# Patient Record
Sex: Female | Born: 2005
Health system: Southern US, Community
[De-identification: ages and names within clinical notes are randomized; demographics above are authoritative.]

## PROBLEM LIST (undated history)

## (undated) DIAGNOSIS — H6690 Otitis media, unspecified, unspecified ear: Secondary | ICD-10-CM

## (undated) DIAGNOSIS — K5900 Constipation, unspecified: Secondary | ICD-10-CM

## (undated) DIAGNOSIS — K219 Gastro-esophageal reflux disease without esophagitis: Secondary | ICD-10-CM

## (undated) DIAGNOSIS — H7090 Unspecified mastoiditis, unspecified ear: Secondary | ICD-10-CM

## (undated) HISTORY — DX: Gastro-esophageal reflux disease without esophagitis: K21.9

---
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
## 2023-01-09 ENCOUNTER — Ambulatory Visit: Payer: Medicaid Other | Admitting: Gastroenterology

## 2023-02-25 DIAGNOSIS — R110 Nausea: Secondary | ICD-10-CM | POA: Diagnosis not present

## 2023-02-25 DIAGNOSIS — Z7184 Encounter for health counseling related to travel: Secondary | ICD-10-CM | POA: Diagnosis not present

## 2023-03-24 DIAGNOSIS — J209 Acute bronchitis, unspecified: Secondary | ICD-10-CM | POA: Diagnosis not present

## 2023-03-24 DIAGNOSIS — R051 Acute cough: Secondary | ICD-10-CM | POA: Diagnosis not present

## 2023-03-31 DIAGNOSIS — R059 Cough, unspecified: Secondary | ICD-10-CM | POA: Diagnosis not present

## 2023-03-31 DIAGNOSIS — J40 Bronchitis, not specified as acute or chronic: Secondary | ICD-10-CM | POA: Diagnosis not present

## 2023-03-31 DIAGNOSIS — J329 Chronic sinusitis, unspecified: Secondary | ICD-10-CM | POA: Diagnosis not present

## 2023-06-11 IMAGING — CT CT HEAD W/O CM
3 series · 14 of 47 positions shown, 16 images · non-contrast
Comparison: 06/13/2021 CT head.

CLINICAL DATA: Otitis media versus early mastoiditis with no
abscess on 06/13/2021

EXAM:
CT HEAD WITHOUT CONTRAST
TECHNIQUE: Contiguous axial images were obtained from the base of the skull
through the vertex without intravenous contrast.

[Series 2: head wo · axial · 0.46mm/px · z∈[+1338,+1494]mm · 8 of 37 slices shown, 10 images]
[im 3/37  brain]
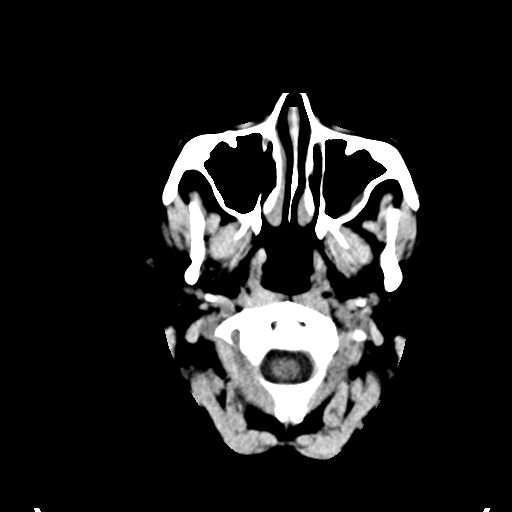
[im 3/37  bone]
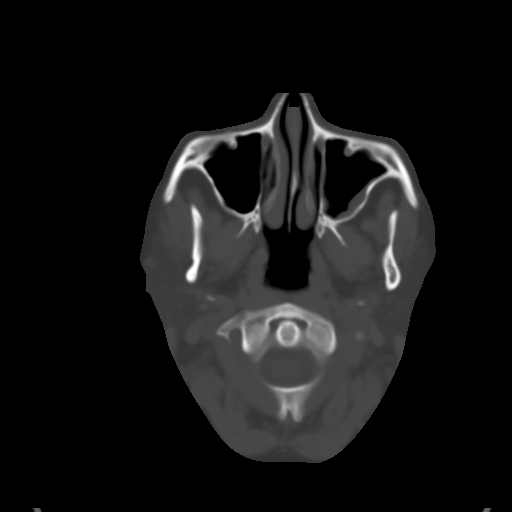
[im 8/37  brain]
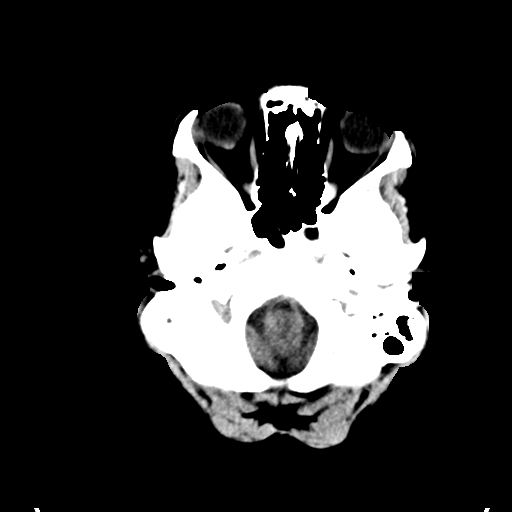
[im 12/37  brain]
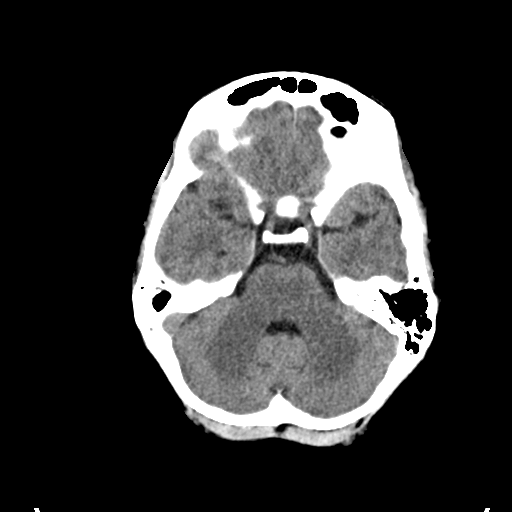
[im 17/37  brain]
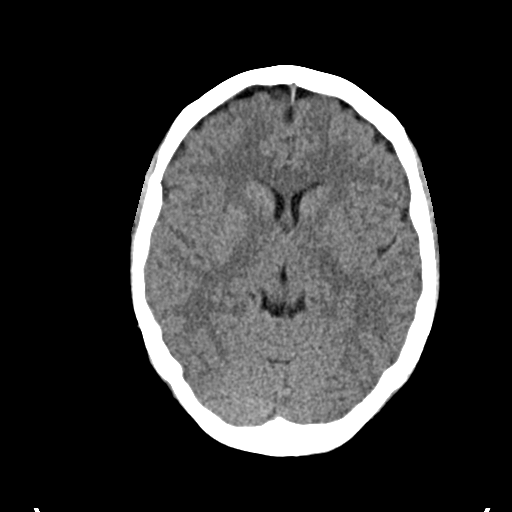
[im 20/37  brain]
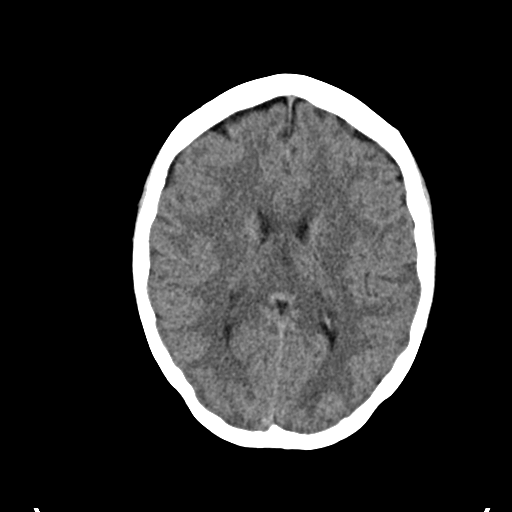
[im 20/37  bone]
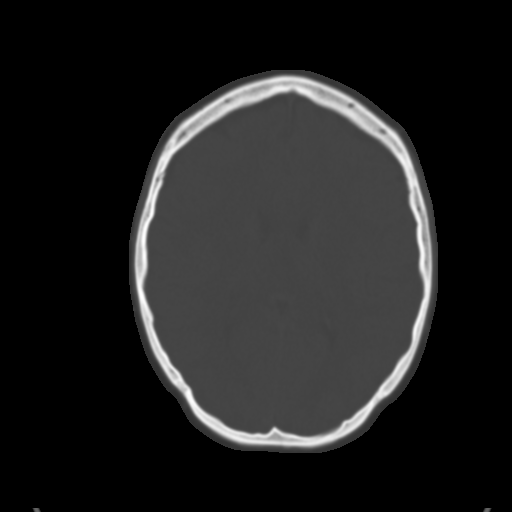
[im 25/37  brain]
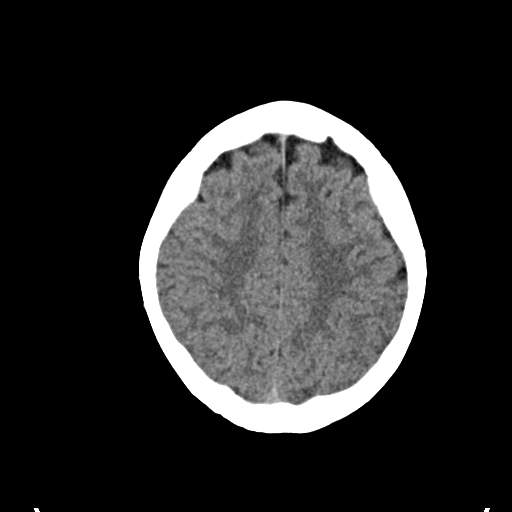
[im 29/37  brain]
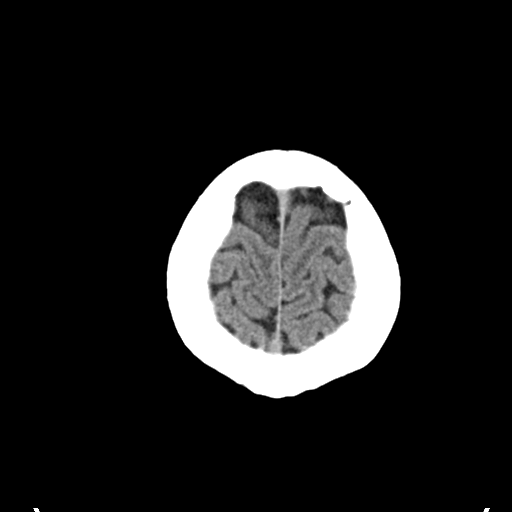
[im 34/37  brain]
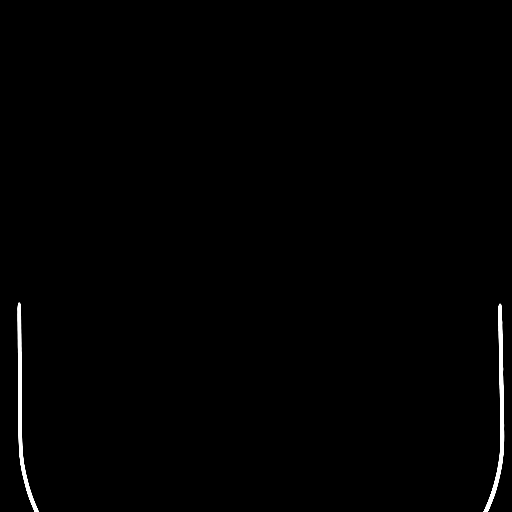

[Series 4: coronal soft · coronal · 0.35mm/px · 3 of 67 slices shown]
[im 23/67  brain]
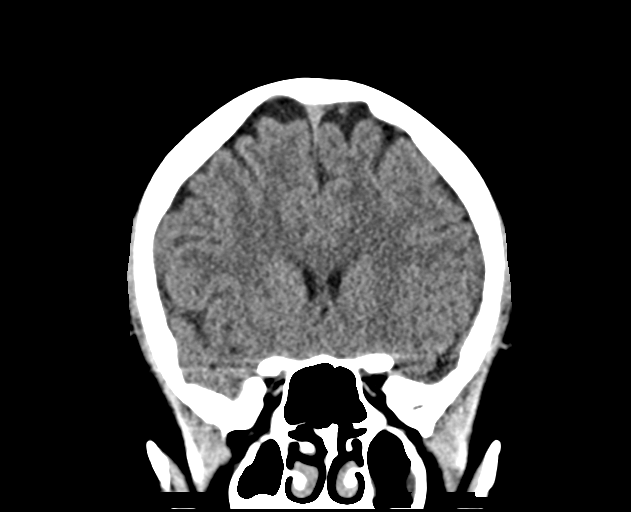
[im 30/67  brain]
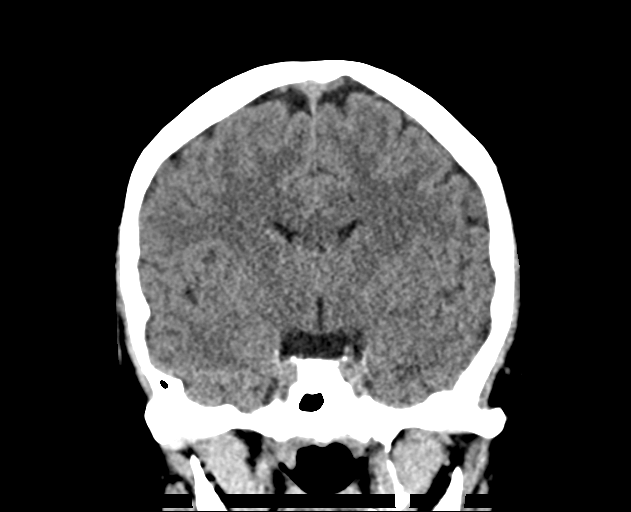
[im 37/67  brain]
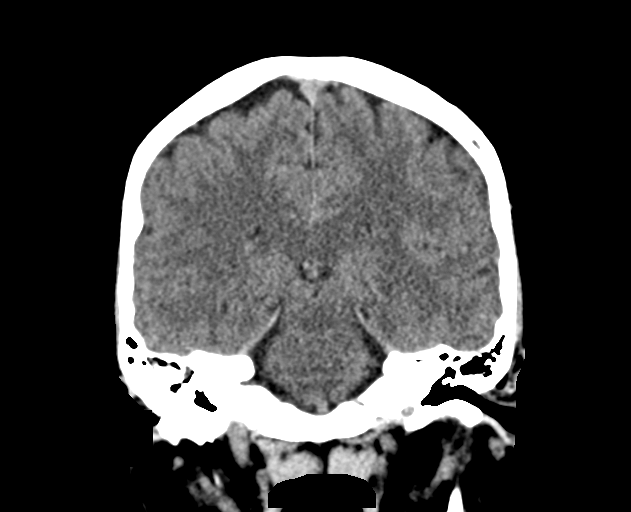

[Series 5: sag soft · sagittal · 0.35mm/px · 3 of 67 slices shown]
[im 23/67  brain]
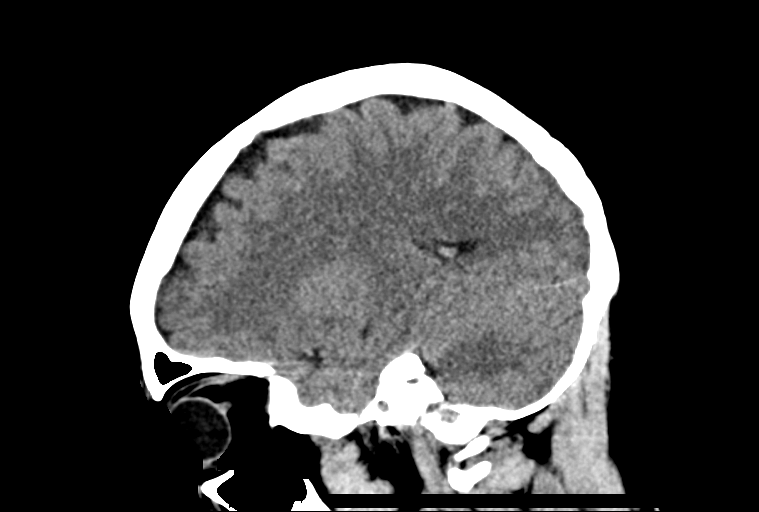
[im 34/67  brain]
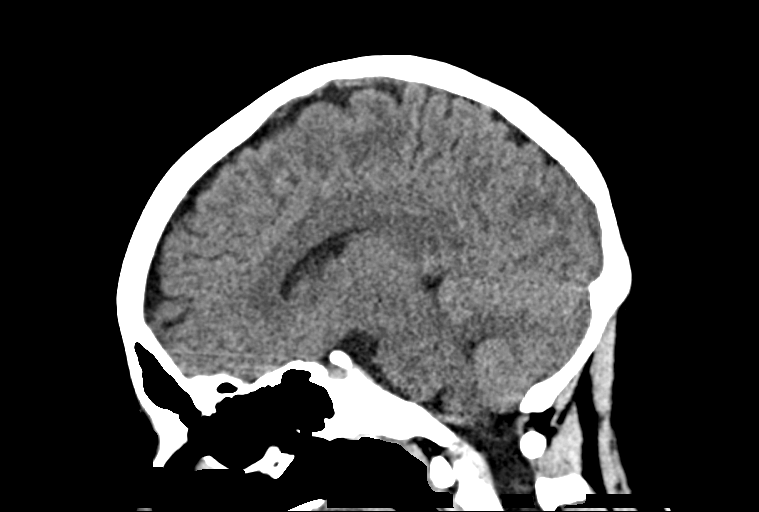
[im 45/67  brain]
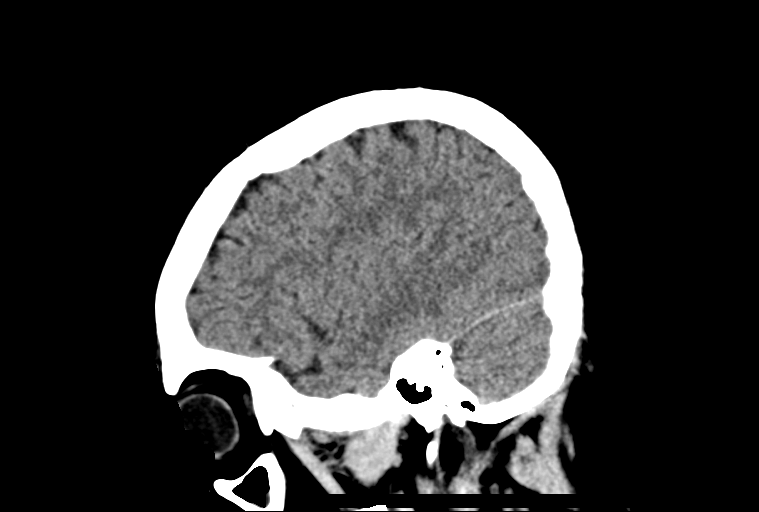

[14 of 47 positions shown; findings below may reference images not displayed]

FINDINGS: Brain: No evidence of acute infarction, hemorrhage, hydrocephalus,
extra-axial collection or mass lesion/mass effect.

Vascular: No hyperdense vessel or unexpected calcification.

Skull: Normal. Negative for fracture or focal lesion.

Sinuses/Orbits: Mucosal thickening in the left maxillary sinus.
Other previously noted paranasal sinus disease has resolved. The
orbits are unremarkable.

Other: Fluid throughout the right mastoid air cells, with decreased
fluid in the right middle ear. Minimal opacification the left
mastoid air cells. No evidence of Smejkal seventh or erosion. No
overlying soft tissue abnormality.
IMPRESSION: 1. Redemonstrated right mastoid effusion, with improved aeration the
right middle ear. No evidence of mastoid coalescence or erosion.
This likely represents improved, uncomplicated otomastoiditis. No
overlying soft tissue abnormality.
2. Interval improvement in previously noted sinus disease, with
mucosal thickening remaining in the left maxillary sinus only.

## 2023-07-18 ENCOUNTER — Telehealth: Payer: Self-pay | Admitting: Gastroenterology

## 2023-07-18 NOTE — Telephone Encounter (Unsigned)
Good afternoon Dr Myrtie Neither   Patient preferred provider  Patient requesting to transfer care over from [facility].. which she was previously seen seen in January of 2024 for chronic constipation.   Requesting transfer due to a recommendation. Records are available for review in epic. Please review and advise on scheduling.   Thank you

## 2023-07-23 NOTE — Telephone Encounter (Signed)
Request received to transfer GI care from outside practice to  GI.  We appreciate the interest in our practice, however due to high demand from patients without established GI providers, we cannot accommodate this transfer.    - H. Myrtie Neither, MD

## 2023-08-20 NOTE — Telephone Encounter (Signed)
 The patient has been advise of recommendations below.

## 2023-12-24 ENCOUNTER — Encounter: Payer: Self-pay | Admitting: Obstetrics and Gynecology

## 2023-12-24 ENCOUNTER — Ambulatory Visit: Admitting: Obstetrics and Gynecology

## 2023-12-24 VITALS — BP 108/62 | HR 115 | Temp 98.5°F | Ht 62.25 in | Wt 154.8 lb

## 2023-12-24 DIAGNOSIS — Z3009 Encounter for other general counseling and advice on contraception: Secondary | ICD-10-CM

## 2023-12-24 DIAGNOSIS — N926 Irregular menstruation, unspecified: Secondary | ICD-10-CM

## 2023-12-24 NOTE — Progress Notes (Signed)
   18 y.o. female here for irregular cycles. Single, engaged. Planning wedding Feb 2026.  Patient's last menstrual period was 12/12/2023 (approximate). Period Cycle (Days): -1 Period Pattern: Regular Menstrual Flow: Moderate Menstrual Control: Tampon, Maxi pad Dysmenorrhea: (!) Mild Menarche: 18yo  She reports cycles have always been irregular. Becoming more regular over time. Would like to discuss birth control options. Currently abstinent.  No tob use, DVT hx, migraines.  Cycle history: 1/16-22 Spotting in February 2/26-3/3 Has spotting in March 4/2-6 5/2-7  Birth control: never Sexually active: never   GYN HISTORY: No significant history   OB History  No obstetric history on file.    Past Medical History:  Diagnosis Date   Constipation    Ear infection    Gastroesophageal reflux    Mastoiditis     Past Surgical History:  Procedure Laterality Date   adnoidectomy  2012   TONSILLECTOMY  2012    Current Outpatient Medications on File Prior to Visit  Medication Sig Dispense Refill   linaclotide  (LINZESS ) 290 MCG CAPS capsule Take 1 capsule (290 mcg total) by mouth daily. (Patient not taking: Reported on 12/24/2023) 30 capsule 11   omeprazole  (PRILOSEC) 40 MG capsule Take 1 capsule (40 mg total) by mouth daily. (Patient not taking: Reported on 12/24/2023) 90 capsule 3   No current facility-administered medications on file prior to visit.    Allergies  Allergen Reactions   Other Shortness Of Breath, Swelling and Other (See Comments)    Drink juicebox "Capri Sun"- Throat turned red, also   Pineapple Anaphylaxis, Shortness Of Breath and Swelling    'SHUT DOWN HER ORGANS"      PE Today's Vitals   12/24/23 1349  BP: 108/62  Pulse: (!) 115  Temp: 98.5 F (36.9 C)  TempSrc: Oral  SpO2: 97%  Weight: 154 lb 12.8 oz (70.2 kg)  Height: 5' 2.25" (1.581 m)   Body mass index is 28.09 kg/m.  Physical Exam Vitals reviewed.  Constitutional:      General:  She is not in acute distress.    Appearance: Normal appearance.  HENT:     Head: Normocephalic and atraumatic.     Nose: Nose normal.  Eyes:     Extraocular Movements: Extraocular movements intact.     Conjunctiva/sclera: Conjunctivae normal.  Pulmonary:     Effort: Pulmonary effort is normal.  Musculoskeletal:        General: Normal range of motion.     Cervical back: Normal range of motion.  Neurological:     General: No focal deficit present.     Mental Status: She is alert.  Psychiatric:        Mood and Affect: Mood normal.        Behavior: Behavior normal.       Assessment and Plan:        Irregular periods -     Thyroid  Panel With TSH -     FSH/LH -     Prolactin -     Testos,Total,Free and SHBG (Female) -     hCG, quantitative, pregnancy  Encounter for counseling regarding contraception -     IUD Insertion; Future   Contraceptive options were reviewed, including pills, patches, vaginal rings, injection, implant, IUDs and sterilization as indicated. R/b/I/c were reviewed. All questions were answered. Considering Paraguard vs Kyleena, pamphlets provided Will call once she has decided  Consider pelvic US  if normal labs Romaine Closs, MD

## 2023-12-25 ENCOUNTER — Ambulatory Visit: Payer: Self-pay | Admitting: Obstetrics and Gynecology

## 2023-12-29 LAB — TESTOS,TOTAL,FREE AND SHBG (FEMALE)
Free Testosterone: 3.1 pg/mL (ref 0.1–6.4)
Sex Hormone Binding: 29 nmol/L (ref 17–124)
Testosterone, Total, LC-MS-MS: 23 ng/dL (ref 2–45)

## 2023-12-29 LAB — HCG, QUANTITATIVE, PREGNANCY: HCG, Total, QN: <5 m[IU]/mL

## 2023-12-29 LAB — THYROID PANEL WITH TSH
Free Thyroxine Index: 2.3 (ref 1.4–3.8)
T3 Uptake: 28 % (ref 22–35)
T4, Total: 8.2 ug/dL (ref 5.3–11.7)
TSH: 1.28 m[IU]/L

## 2023-12-29 LAB — PROLACTIN: Prolactin: 9.8 ng/mL

## 2023-12-29 LAB — FSH/LH
FSH: 6.3 m[IU]/mL
LH: 7.9 m[IU]/mL

## 2024-01-01 ENCOUNTER — Encounter: Payer: Self-pay | Admitting: Obstetrics and Gynecology

## 2024-01-01 ENCOUNTER — Other Ambulatory Visit: Payer: Self-pay | Admitting: Obstetrics and Gynecology

## 2024-01-01 DIAGNOSIS — N926 Irregular menstruation, unspecified: Secondary | ICD-10-CM

## 2024-01-15 NOTE — Telephone Encounter (Signed)
 PUS appt scheduled for 02/24/2024. Encounter closed.

## 2024-01-15 NOTE — Telephone Encounter (Signed)
Mychart msg returned unread.  

## 2024-01-15 NOTE — Telephone Encounter (Signed)
 LDVMOM per DPR advising the pt.  Will leave encounter in triage pool to f/u w/ pt for PUS appt. Order already in

## 2024-01-26 DIAGNOSIS — K5904 Chronic idiopathic constipation: Secondary | ICD-10-CM | POA: Diagnosis not present

## 2024-01-26 DIAGNOSIS — R1013 Epigastric pain: Secondary | ICD-10-CM | POA: Diagnosis not present

## 2024-01-26 NOTE — Progress Notes (Signed)
 ------------------------------------------------------------------------------- Attestation signed by Charlie KATHEE Ang, MD at 01/29/2024  5:32 PM ATTENDING PHYSICIAN STATEMENT: I was present for the critical portion of the service for this patient. In doing so, I have reviewed the patient's chart, physical examination and laboratory data; independently visualized available imaging; seen and talked with the patient; and reviewed the resident's diagnostic impression and plans.   I concur with the resident's decision making and treatment plans, as outlined in the progress note.   Richard B. Ang, M.D.  -------------------------------------------------------------------------------  Patient: Michelle Diaz; 14-Jul-2006; MRN: [id number] Referring physician: No ref. provider found Reason for Visit:  new evaluation and treatment of constipation  History of Present Illness: Michelle Diaz is a 18 y.o. female with unremarkable history who presents for evaluation of constipation.  Seen at [facility] on 09/05/22. She did not find these interventions helpful, so is seeking care here.  She states that she has had constipation since she was 1 years ago. She saw a PEDS GI doctor from age [age]. She was on daily Miralax  from age [age]. She would go to the ER every 1-2 years with severe constipation. She had EGD/colonoscopy when she was 10 in 2017. She was told she was lacking water in her colon. She has bloating when she is constipated. She averages 2 BM's/week. [place] 1-2. Lots of straining. She takes Dulcolax PRN to have a BM, average once every 2 months, but avoids taking it because it causes pain with having a BM. Linzess  helped have BM's but caused constant abdominal pain.  She was on Prilosec from age [age] and it caused adverse effects including supragastric belching. She underwent pH/impedence testing at [facility] in 2020 that reportedly did not show reflux, so she stopped her PPI, tried baclofen   and this helped resolve the belching.  She has periumbilical pain 2-3x/week. She gets it after she eats. Sleeping makes it better. She tries to eat only one meal at night to be able to sleep the pain off. Having a BM does relieve the pain some.  She has been on no daily medications recently.  She has been on Zofran  for 1 year PRN. Takes it once monthly. No history of vomiting.  No melena, hematochezia, weight loss, poor appetite.  She does not regularly exercise. The quality of her diet is okay and describes it as restrictive - eats mostly chicken and potatoes, sometimes greens as home.  Medications: -Pepcid - adverse effects -Prilosec - adverse effects -Nexium  - adverse effects -Miralax  - didn't work -Metamucil - has not tried -Senna - didn't work -Reglan - unsure if helped -Linzess  - in constant pain -Zofran  PRN - current -Probiotics -Low FODMAP diet  PMH/Problems Patient Active Problem List   Diagnosis Date Noted  . Dysfunction of right eustachian tube 02/26/2022  . Referred otalgia of right ear 02/26/2022  . Myringotomy tube status 08/21/2021  . Frequent headaches 06/26/2021  . Non-recurrent acute suppurative otitis media of right ear 06/14/2021  . Slow transit constipation 07/17/2015  . Gastroesophageal reflux disease without esophagitis 07/17/2015  . Periumbilical pain 07/17/2015    Medical History[1] Surgical History[2]  Family History Family History[3]  Social History Social History[4]  Outpatient Medications Current Medications[5]  Allergies Allergies[6]  Review of Systems:  Review of systems negative unless noted above.  Objective:   Vitals:   01/26/24 1252  BP: 119/78  Pulse: 81  SpO2: 100%   Gen: well-developed, NAD CV: appears well-perfused Abd: non-distended. Mild TTP at periumbilical area. No Carnett's Ext: no swelling  Skin: no jaundice, rashes or lesions Neuro: no gross focal deficits Psych: pleasant and cooperative  Labs:  No  results found for: WBC, HGB, HCT, MCV, PLT No results found for: NA, K, CL, BICARB, BUN, CREAT, GLUCOSE, CA No results found for: ALT, AST, GGT, BILITOT No results found for: PPD No results found for: HAV, HEPAIGM, HEPBIGM, HEPBCAB, HBEAG, HEPCAB No results found for: SEDRATE No results found for: CRP  Assessment/Plan:  Michelle Diaz is a 18 y.o. female with unremarkable history who presents for evaluation of constipation.  #) Chronic idiopathic constipation vs IBS-C Differential includes CIC, IBS-C, dyssynergic defecation, less likely Hirschsprung's disease. Will pursue ARM to rule out dyssynergic defecation and Hirschsprung's disease. In meantime, we suggested a trial of daily psyllium husk. --Anorectal manometry (ordered) --Trial of daily psyllium husk (OTC) --Consider pelvic floor PT vs flex sig with deep biopsy pending results of ARM --Referral to GI Nutrition (ordered)  #) Dyspepsia Will obtain H pylori breath testing to rule out HP. Treat if positive. She is interested in trying natural medications like EC-coated peppermint oil over antispasmodic meds like Bentyl. Trial of FDGard w/ meals PRN in meantime. --H pylori breath testing (ordered) --Trial of FD-Gard as needed before meals (OTC) --Consider Bentyl in future --Consider TCA in future  #) Nausea Nausea is relatively mild. Suspect related to constipation. No real early satiety or vomiting that would suggest gastroparesis. --Continue Zofran  PRN - pt will reach out with renewal requests as needed --Consider gastric emptying study in future  Follow up - Return in about 4 months (around 05/27/2024).  Orders: Orders Placed This Encounter  Procedures  . Helicobacter pylori Urea  Breath Test    Standing Status:   Future    Expected Date:   01/26/2024    Expiration Date:   01/25/2025    Release to patient::   Immediate  . Ambulatory referral to Gastroenterology     Standing Status:   Future    Expiration Date:   02/24/2025    Referral Priority:   Routine    Referral Type:   PROVIDER REFERRAL    Referred to Provider:   Andrez Anette Bunde, RD    Requested Specialty:   Gastroenterology    Number of Visits Requested:   1   Medications: No orders of the defined types were placed in this encounter.  The patient was seen and discussed with Dr. Dena.  Terrall Parkin MD Gastroenterology Fellow PGY-5       [1] History reviewed. No pertinent past medical history. [2] Past Surgical History: Procedure Laterality Date  . ADENOIDECTOMY     Procedure: ADENOIDECTOMY  . TONSILLECTOMY     Procedure: TONSILLECTOMY  [3] Family History Problem Relation Name Age of Onset  . No Known Problems Mother    . No Known Problems Father    . Depression Sister    . Anxiety disorder Sister    . GER disease Brother    . Irritable bowel syndrome Brother    . GER disease Maternal Grandmother    . GER disease Maternal Grandfather    . Depression Maternal Grandfather    . Anxiety disorder Maternal Grandfather    . Esophagitis Paternal Grandmother    [4] Social History Tobacco Use  . Smoking status: Never  . Smokeless tobacco: Never  Substance Use Topics  . Alcohol use: Never  . Drug use: No  [5]  Current Outpatient Medications:  .  ammonium lactate  12 % crea, Apply  topically., Disp: ,  Rfl:  .  inulin (Fiber Gummies) 2 gram chew, Take 2 capsules by mouth., Disp: , Rfl:  .  Lactobacillus acidophilus (Acidophilus) chew, Take 1 capsule by mouth., Disp: , Rfl:  .  metoclopramide (REGLAN) 5 mg tablet, TAKE 1 TABLET (5 MG) BY MOUTH BY MOUTH BEFORE MEALS, Disp: , Rfl:  .  ofloxacin (FLOXIN) 0.3 % otic solution, INSTILL 10 DROPS INTO AFFECTED EAR DAILY FOR 7 DAYS, Disp: , Rfl:  .  polyethylene glycol (MIRALAX ) 17 gram powd powder, Take 17 g by mouth Once Daily., Disp: 1,530 g, Rfl: 3 .  rizatriptan  (MAXALT ) 10 mg tablet, Take 10 mg by mouth as needed for  migraine., Disp: , Rfl:  [6] Allergies Allergen Reactions  . Pineapple Swelling    unknown

## 2024-02-19 DIAGNOSIS — M79605 Pain in left leg: Secondary | ICD-10-CM | POA: Diagnosis not present

## 2024-02-19 DIAGNOSIS — L02416 Cutaneous abscess of left lower limb: Secondary | ICD-10-CM | POA: Diagnosis not present

## 2024-02-24 ENCOUNTER — Ambulatory Visit (INDEPENDENT_AMBULATORY_CARE_PROVIDER_SITE_OTHER)

## 2024-02-24 ENCOUNTER — Other Ambulatory Visit: Admitting: Obstetrics and Gynecology

## 2024-02-24 DIAGNOSIS — N926 Irregular menstruation, unspecified: Secondary | ICD-10-CM | POA: Diagnosis not present

## 2024-02-25 ENCOUNTER — Ambulatory Visit (INDEPENDENT_AMBULATORY_CARE_PROVIDER_SITE_OTHER): Admitting: Obstetrics and Gynecology

## 2024-02-25 ENCOUNTER — Encounter: Payer: Self-pay | Admitting: Obstetrics and Gynecology

## 2024-02-25 VITALS — BP 104/60 | HR 90 | Temp 98.0°F | Wt 155.0 lb

## 2024-02-25 DIAGNOSIS — Z3009 Encounter for other general counseling and advice on contraception: Secondary | ICD-10-CM

## 2024-02-25 DIAGNOSIS — Z01812 Encounter for preprocedural laboratory examination: Secondary | ICD-10-CM | POA: Diagnosis not present

## 2024-02-25 DIAGNOSIS — Z30430 Encounter for insertion of intrauterine contraceptive device: Secondary | ICD-10-CM

## 2024-02-25 DIAGNOSIS — N926 Irregular menstruation, unspecified: Secondary | ICD-10-CM

## 2024-02-25 LAB — PREGNANCY, URINE: Preg Test, Ur: NEGATIVE

## 2024-02-25 MED ORDER — LIDOCAINE HCL URETHRAL/MUCOSAL 2 % EX GEL: 1.0000 | Freq: Once | CUTANEOUS | Status: DC

## 2024-02-25 MED ORDER — LEVONORGESTREL 19.5 MG IU IUD
INTRAUTERINE_SYSTEM | Freq: Once | INTRAUTERINE | Status: AC
Start: 2024-02-25 — End: 2024-02-25

## 2024-02-25 NOTE — Patient Instructions (Signed)
 Intrauterine Device (IUD) Insertion: What to Expect  An intrauterine device (IUD) is put in (inserted) your uterus to prevent pregnancy. It's a small, T-shaped device that has one or two nylon strings hanging down from it. The strings hang out of your cervix, which is the lowest part of your uterus. Tell a health care provider about: Any allergies you have. All medicines you take. These include vitamins, herbs, eye drops, and creams. Any surgeries you have had. Any medical problems you have. Whether you're pregnant or may be pregnant. What are the risks? Your health care provider will talk with you about risks. These may include: Infection. Bleeding. Allergic reactions to medicines. A cut to the uterus, also called perforation, or damage to other structures or organs. Accidental placement of the IUD either in the muscle layer of the uterus or outside the uterus. The IUD falling out of the uterus. This is more common if you recently had a baby. Higher risk of ectopic pregnancy. This is when an egg is fertilized outside your uterus. This is rare. Pelvic inflammatory disease (PID). This is an infection in the uterus and fallopian tubes. The IUD doesn't cause the infection. The infection is usually from a sexually transmitted infection (STI). If this happens, it is usually during the first 20 days after the IUD is put in. This is rare. What happens before? Ask about changing or stopping: Any medicines you take. Any vitamins, herbs, or supplements you take. Your provider may tell you to take pain medicines you can buy at the store before the procedure. You may have tests for: Pregnancy. You may have a pee (urine) or blood sample taken. STIs. Placing an IUD can make an infection worse. To check for cervical cancer. You may have a Pap test, which is when cells from your cervix are removed for testing. What happens during an IUD insertion? A tool, called a speculum, will be placed in your  vagina and widened so that your provider can see your cervix. A medicine to clean your cervix may be used to help lower your risk of infection. You may be given medicine to numb your cervix. This medicine is usually given by an injection into your cervix. A tool will be put into your uterus to check the length of your uterus. A thin tube that holds the IUD will be put into your vagina, through the opening of your cervix, and into your uterus. The IUD will be placed in your uterus. The tube that holds the IUD will be removed. The strings that are attached to the IUD will be trimmed so that they sit just outside your cervix. The speculum will be removed. These steps may vary. Ask what you can expect. What happens after? You may have: Bleeding. It can vary from light bleeding or spotting for a few days to period-like bleeding. This is normal. Cramps and pain in your belly. Dizziness or light-headedness. Pain in your lower back. Headaches and the feeling like you may throw up Follow these instructions at home: Do not have sex or put anything into your vagina for 24 hours after the IUD is placed. Before having sex, check to make sure that you can feel the IUD string or strings. You should be able to feel the end of the string below the opening of your cervix. If your IUD string is in place, you may continue with sex. If you had a hormonal IUD put in more than 7 days after your most recent period  started, you will need to use a backup method of birth control for 7 days after the IUD was placed. Hormones are chemicals that affect how the body works. Check that the IUD is still in place by feeling for the strings after every period, or check once a month. Use a condom every time you have sex to prevent STIs. An IUD won't protect you from STIs. Take your medicines only as told. Contact a health care provider if: You have any of the following problems with your IUD string or strings: The string  bothers or hurts you or your sexual partner. You can't feel the string. The string has gotten longer. The IUD comes out or you can feel the IUD in your vagina. You think you may be pregnant, or you miss your period. You think you may have an STI. You have bad-smelling discharge from your vagina. You have a fever and chills. You have pain during sex. Get help right away if: You have heavy bleeding, which means soaking more than 2 pads per hour for 2 hours in a row. You have sudden, really bad belly pain. This information is not intended to replace advice given to you by your health care provider. Make sure you discuss any questions you have with your health care provider. Document Revised: 04/07/2023 Document Reviewed: 04/07/2023 Elsevier Patient Education  2024 [employer].

## 2024-02-25 NOTE — Progress Notes (Signed)
 18 y.o. female with irregular cycles here for ultrasound review and IUD insertion. Single, engaged. Planning wedding Feb 2026.  Patient's last menstrual period was 02/16/2024 (approximate). Period Duration (Days): 7 Period Pattern: Regular Menstrual Flow: Moderate Menstrual Control: Maxi pad, Tampon Dysmenorrhea: None Menarche: 18yo  At annual exam, she reported: She reports cycles have always been irregular. Becoming more regular over time. Would like to discuss birth control options. Currently abstinent.  No tob use, DVT hx, migraines.  Cycle history: 1/16-22 Spotting in February 2/26-3/3 Has spotting in March 4/2-6 5/2-7  Today she reports really heavy bleeding with most recent period. Normal hormonal workup completed 12/24/2023. Taking antibiotics for abscess of inner thigh.  Birth control: never Sexually active: never   GYN HISTORY: No significant history   OB History  Gravida Para Term Preterm AB Living  0 0 0 0 0 0  SAB IAB Ectopic Multiple Live Births  0 0 0 0 0    Past Medical History:  Diagnosis Date   Constipation    Ear infection    Gastroesophageal reflux    Mastoiditis     Past Surgical History:  Procedure Laterality Date   adnoidectomy  2012   TONSILLECTOMY  2012    Current Outpatient Medications on File Prior to Visit  Medication Sig Dispense Refill   doxycycline (VIBRAMYCIN) 100 MG capsule Take 100 mg by mouth every 12 (twelve) hours.     ibuprofen  (ADVIL ) 800 MG tablet Take 800 mg by mouth every 8 (eight) hours as needed.     silver sulfADIAZINE (SILVADENE) 1 % cream Apply topically 2 (two) times daily.     No current facility-administered medications on file prior to visit.    Allergies  Allergen Reactions   Other Shortness Of Breath, Swelling and Other (See Comments)    Drink juicebox Capri Sun- Throat turned red, also   Pineapple Anaphylaxis, Shortness Of Breath and Swelling    'SHUT DOWN HER ORGANS      PE Today's  Vitals   02/25/24 1602  BP: 104/60  Pulse: 90  Temp: 98 F (36.7 C)  TempSrc: Oral  SpO2: 98%  Weight: 155 lb (70.3 kg)   Body mass index is 28.12 kg/m.  Physical Exam Vitals reviewed. Exam conducted with a chaperone present.  Constitutional:      General: She is not in acute distress.    Appearance: Normal appearance.  HENT:     Head: Normocephalic and atraumatic.     Nose: Nose normal.  Eyes:     Extraocular Movements: Extraocular movements intact.     Conjunctiva/sclera: Conjunctivae normal.  Pulmonary:     Effort: Pulmonary effort is normal.  Genitourinary:    General: Normal vulva.     Exam position: Lithotomy position.     Vagina: Normal. No vaginal discharge.     Cervix: Normal. No cervical motion tenderness, discharge or lesion.     Uterus: Normal. Not enlarged and not tender.      Adnexa: Right adnexa normal and left adnexa normal.  Musculoskeletal:        General: Normal range of motion.     Cervical back: Normal range of motion.  Neurological:     General: No focal deficit present.     Mental Status: She is alert.  Psychiatric:        Mood and Affect: Mood normal.        Behavior: Behavior normal.     02/25/24 Pelvic US : Indications: Irregular periods  Findings:  Uterus: 7.2 x 3.3 x 3.6 cm, anteverted. Endometrial thickness: 8 mm. Left ovary: 3.6 x 1.8 x 2.0 cm, normal-appearing. Right ovary: 4.1 x 1.8 x 2.1 cm, normal-appearing. No free fluid.  Impression:  Normal pelvic ultrasound  Vera LULLA Pa, MD  Procedure:   Consent was signed. Timeout was performed. Speculum inserted.  2% glydo applied to cervix (Lot #[id number], expiration 20 27-0 1). Speculum removed, and patient given at leat 10 mins prior to proceeding with procedure. Speculum was then re-inserted.  Cervix visualized and cleansed with Betadine x 3.  Tenaculum placed on anterior cervix. Then uterus sounded to 7 cm.  Kyleena  IUD (Lot#: [id number], Exp 2027/March) was placed according to  manufacturer's guidelines. Strings trimmed to 2 cm.  Minimal bleeding noted.  Pt tolerated the procedure well.  Assessment and Plan:        Pre-procedure lab exam -     Pregnancy, urine  Irregular periods Assessment & Plan: Normal hormonal workup and TVUS today Anovulatory bleeding Recommend Kyleena  IUD over ParaGard for cycle control Patient is in agreement.  Encounter for counseling regarding contraception -     IUD Insertion -     lidocaine  -     Levonorgestrel  Instructions and precautions given.  Use back up protection for 7d and always for always for STD prevention.  Follow up in 4-6 weeks, sooner as needed. IUD product card given to patient with date of removal and lot number.   Vera LULLA Pa, MD

## 2024-02-25 NOTE — Assessment & Plan Note (Signed)
 Normal hormonal workup and TVUS today Anovulatory bleeding Recommend Kyleena  IUD over ParaGard for cycle control Patient is in agreement.

## 2024-04-07 ENCOUNTER — Ambulatory Visit (INDEPENDENT_AMBULATORY_CARE_PROVIDER_SITE_OTHER): Admitting: Obstetrics and Gynecology

## 2024-04-07 ENCOUNTER — Encounter: Payer: Self-pay | Admitting: Obstetrics and Gynecology

## 2024-04-07 VITALS — BP 110/60 | HR 94 | Temp 98.0°F | Wt 155.0 lb

## 2024-04-07 DIAGNOSIS — Z975 Presence of (intrauterine) contraceptive device: Secondary | ICD-10-CM | POA: Diagnosis not present

## 2024-04-07 NOTE — Progress Notes (Signed)
 18 y.o. female with irregular cycles, Kyleena  (inserted 02/25/24), IBS, chronic constipation here for IUD check. Single, engaged. Planning wedding Feb 2026.  Patient's last menstrual period was 03/17/2024 (exact date).   Menarche: 18yo  She reports pain with tampon insertion with last period. Has not tried to use again. No issues with use before. Mild pelvic cramping, has not needed meds since week after IUD insertion. Once weekly spotting.  Birth control: never Sexually active: never   GYN HISTORY: No significant history   OB History  Gravida Para Term Preterm AB Living  0 0 0 0 0 0  SAB IAB Ectopic Multiple Live Births  0 0 0 0 0    Past Medical History:  Diagnosis Date   Constipation    Ear infection    Gastroesophageal reflux    Mastoiditis     Past Surgical History:  Procedure Laterality Date   adnoidectomy  2012   TONSILLECTOMY  2012    Current Outpatient Medications on File Prior to Visit  Medication Sig Dispense Refill   FIBER PO Take by mouth.     Psyllium (METAMUCIL PO) Take by mouth.     silver sulfADIAZINE (SILVADENE) 1 % cream Apply topically 2 (two) times daily.     No current facility-administered medications on file prior to visit.    Allergies  Allergen Reactions   Other Shortness Of Breath, Swelling and Other (See Comments)    Drink juicebox Capri Sun- Throat turned red, also   Pineapple Anaphylaxis, Shortness Of Breath and Swelling    'SHUT DOWN HER ORGANS      PE Today's Vitals   04/07/24 1426  BP: 110/60  Pulse: 94  Temp: 98 F (36.7 C)  TempSrc: Oral  SpO2: 99%  Weight: 155 lb (70.3 kg)   Body mass index is 28.12 kg/m.  Physical Exam Vitals reviewed. Exam conducted with a chaperone present.  Constitutional:      General: She is not in acute distress.    Appearance: Normal appearance.  HENT:     Head: Normocephalic and atraumatic.     Nose: Nose normal.  Eyes:     Extraocular Movements: Extraocular movements  intact.     Conjunctiva/sclera: Conjunctivae normal.  Pulmonary:     Effort: Pulmonary effort is normal.  Genitourinary:    General: Normal vulva.     Exam position: Lithotomy position.     Vagina: Normal. No vaginal discharge.     Cervix: Normal. No cervical motion tenderness, discharge or lesion.     Uterus: Normal. Not enlarged and not tender.      Adnexa: Right adnexa normal and left adnexa normal.     Comments: IUD strings present. Mild TTP of anterior pelvic floor, increased tone noted during bimanual exam Musculoskeletal:        General: Normal range of motion.     Cervical back: Normal range of motion.  Neurological:     General: No focal deficit present.     Mental Status: She is alert.  Psychiatric:        Mood and Affect: Mood normal.        Behavior: Behavior normal.    Assessment and Plan:        Uses hormone releasing intrauterine device (IUD) for contraception  Normal string check. However, increased pelvic floor tone noted. Patient reports history of IBS/constipation. Recommend deep breathing exercises and trial of tampon reinsertion. If sx are still present, recommend referral to PFPT.  Daray Polgar LULLA Pa,  MD

## 2024-05-03 ENCOUNTER — Encounter: Payer: Self-pay | Admitting: Obstetrics and Gynecology

## 2024-05-05 DIAGNOSIS — R300 Dysuria: Secondary | ICD-10-CM | POA: Diagnosis not present

## 2024-05-05 DIAGNOSIS — K581 Irritable bowel syndrome with constipation: Secondary | ICD-10-CM | POA: Diagnosis not present

## 2024-05-05 DIAGNOSIS — N390 Urinary tract infection, site not specified: Secondary | ICD-10-CM | POA: Diagnosis not present

## 2024-05-06 ENCOUNTER — Ambulatory Visit: Payer: Self-pay
